# Patient Record
Sex: Female | Born: 1965 | Race: Black or African American | Hispanic: No | Marital: Married | Smoking: Never smoker
Health system: Southern US, Community
[De-identification: ages and names within clinical notes are randomized; demographics above are authoritative.]

## PROBLEM LIST (undated history)

## (undated) DIAGNOSIS — E0590 Thyrotoxicosis, unspecified without thyrotoxic crisis or storm: Secondary | ICD-10-CM

## (undated) DIAGNOSIS — I10 Essential (primary) hypertension: Secondary | ICD-10-CM

## (undated) DIAGNOSIS — D649 Anemia, unspecified: Secondary | ICD-10-CM

## (undated) DIAGNOSIS — K219 Gastro-esophageal reflux disease without esophagitis: Secondary | ICD-10-CM

## (undated) DIAGNOSIS — E785 Hyperlipidemia, unspecified: Secondary | ICD-10-CM

## (undated) HISTORY — DX: Hyperlipidemia, unspecified: E78.5

## (undated) HISTORY — PX: HERNIA REPAIR: SHX51

## (undated) HISTORY — PX: TUBAL LIGATION: SHX77

## (undated) HISTORY — DX: Anemia, unspecified: D64.9

## (undated) HISTORY — DX: Gastro-esophageal reflux disease without esophagitis: K21.9

## (undated) HISTORY — PX: MOUTH SURGERY: SHX715

---

## 2001-08-01 ENCOUNTER — Ambulatory Visit (HOSPITAL_COMMUNITY): Admission: RE | Admit: 2001-08-01 | Discharge: 2001-08-01 | Payer: Self-pay | Admitting: Internal Medicine

## 2001-08-01 ENCOUNTER — Encounter: Payer: Self-pay | Admitting: Internal Medicine

## 2005-05-31 ENCOUNTER — Ambulatory Visit (HOSPITAL_COMMUNITY): Admission: RE | Admit: 2005-05-31 | Discharge: 2005-05-31 | Payer: Self-pay | Admitting: Surgery

## 2005-08-29 ENCOUNTER — Emergency Department (HOSPITAL_COMMUNITY): Admission: EM | Admit: 2005-08-29 | Discharge: 2005-08-30 | Payer: Self-pay | Admitting: Emergency Medicine

## 2006-01-11 ENCOUNTER — Ambulatory Visit: Payer: Self-pay | Admitting: Family Medicine

## 2006-03-01 ENCOUNTER — Ambulatory Visit: Payer: Self-pay | Admitting: Family Medicine

## 2006-08-01 DIAGNOSIS — E119 Type 2 diabetes mellitus without complications: Secondary | ICD-10-CM | POA: Insufficient documentation

## 2006-08-01 DIAGNOSIS — E785 Hyperlipidemia, unspecified: Secondary | ICD-10-CM | POA: Insufficient documentation

## 2006-08-01 DIAGNOSIS — E039 Hypothyroidism, unspecified: Secondary | ICD-10-CM | POA: Insufficient documentation

## 2006-08-01 DIAGNOSIS — I10 Essential (primary) hypertension: Secondary | ICD-10-CM | POA: Insufficient documentation

## 2010-08-23 ENCOUNTER — Other Ambulatory Visit: Payer: Self-pay | Admitting: Obstetrics and Gynecology

## 2010-08-23 DIAGNOSIS — Z1231 Encounter for screening mammogram for malignant neoplasm of breast: Secondary | ICD-10-CM

## 2010-08-31 ENCOUNTER — Ambulatory Visit
Admission: RE | Admit: 2010-08-31 | Discharge: 2010-08-31 | Disposition: A | Discharge status: Home / Self Care | Payer: Federal, State, Local not specified - PPO | Source: Ambulatory Visit | Attending: Obstetrics and Gynecology | Admitting: Obstetrics and Gynecology

## 2010-08-31 DIAGNOSIS — Z1231 Encounter for screening mammogram for malignant neoplasm of breast: Secondary | ICD-10-CM

## 2011-01-11 ENCOUNTER — Other Ambulatory Visit: Payer: Self-pay

## 2011-01-11 ENCOUNTER — Encounter (HOSPITAL_COMMUNITY)
Admission: RE | Admit: 2011-01-11 | Discharge: 2011-01-11 | Disposition: A | Discharge status: Home / Self Care | Payer: Federal, State, Local not specified - PPO | Source: Ambulatory Visit | Attending: Obstetrics and Gynecology | Admitting: Obstetrics and Gynecology

## 2011-01-11 ENCOUNTER — Encounter (HOSPITAL_COMMUNITY): Payer: Self-pay

## 2011-01-11 HISTORY — DX: Essential (primary) hypertension: I10

## 2011-01-11 HISTORY — DX: Thyrotoxicosis, unspecified without thyrotoxic crisis or storm: E05.90

## 2011-01-11 LAB — CBC
Hemoglobin: 11.6 g/dL — ABNORMAL LOW (ref 12.0–15.0)
MCH: 26.4 pg (ref 26.0–34.0)
MCHC: 32.3 g/dL (ref 30.0–36.0)
RDW: 14.4 % (ref 11.5–15.5)

## 2011-01-11 LAB — BASIC METABOLIC PANEL
Calcium: 9.8 mg/dL (ref 8.4–10.5)
GFR calc Af Amer: >60 mL/min (ref >60)
GFR calc non Af Amer: >60 mL/min (ref >60)
Glucose, Bld: 167 mg/dL — ABNORMAL HIGH (ref 70–99)
Sodium: 138 mEq/L (ref 135–145)

## 2011-01-11 NOTE — Patient Instructions (Signed)
20 Alizia Benefiel  01/11/2011   Your procedure is scheduled on:  01/16/11  Report to [facility] at 0745 AM....main entrance  Call this number if you have problems the morning of surgery: [phone]   Remember:   Do not eat food:After Midnight.  Do not drink clear liquids: 4 Hours before arrival.  Take these medicines the morning of surgery with A SIP OF WATER: blood pressure meds   Do not wear jewelry, make-up or nail polish.  Do not bring valuables to the hospital.  Contacts, dentures or bridgework may not be worn into surgery.  Leave suitcase in the car. After surgery it may be brought to your room.  For patients admitted to the hospital, checkout time is 11:00 AM the day of discharge.   Patients discharged the day of surgery will not be allowed to drive home.  Name and phone number of your driver: Minerva Areola- [phone]  Special Instructions: N/A   Please read over the following fact sheets that you were given:

## 2011-01-13 NOTE — H&P (Signed)
NAMEADRIEANNA, BOTELER NO.:  [id number]  MEDICAL RECORD NO.:  [id number]  LOCATION:                                 FACILITY:  PHYSICIAN:  Janine Limbo, M.D.DATE OF BIRTH:  08/08/1965  DATE OF ADMISSION:  01/16/2011 DATE OF DISCHARGE:                             HISTORY & PHYSICAL   HISTORY OF PRESENT ILLNESS:  Ms. Puccinelli is a 45 year old female, para 4- 0-3-4, who presents for hysteroscopy with dilatation and curettage.  She will also have resection of endometrial polyps.  The patient has been followed at the [facility].  She complains of menorrhagia and dysmenorrhea.  The patient has had a sonohysterogram which showed endometrial polyps.  No adnexal masses were appreciated.  The patient recently had colposcopy and biopsies which showed cervical intraepithelial neoplasia I.  The patient has had a cesarean section in the past as well as a tubal ligation.  The patient has hypertension and diabetes.  DRUG ALLERGIES:  No known drug allergies.  OBSTETRICAL HISTORY:  The patient has had 4 term deliveries.  One delivery was by cesarean section.  She has had 1 miscarriage and 2 elective pregnancy terminations.  She has had diabetes of pregnancy.  PAST MEDICAL HISTORY:  The patient has hypertension, diabetes, anemia, and elevated cholesterol.  CURRENT MEDICATIONS: 1. Metformin 1000 mg each day. 2. Exforge 10 mg/320 mg. 3. Simvastatin 20 mg. 4. Iron 65 mg. 5. Vitamin D 1000 international units each day. 6. Lantus insulin p.r.n.  SOCIAL HISTORY:  The patient denies cigarette use, alcohol use, and recreational drug use.  REVIEW OF SYSTEMS:  Please see history of present illness.  FAMILY HISTORY:  The patient has a family history of hypertension, diabetes, and strokes.  PHYSICAL EXAMINATION:  VITAL SIGNS:  Weight is 195 pounds, height is 5 feet and 2 inches. HEENT:  Within  normal limits. CHEST:  Clear. HEART:  Regular rate and rhythm. BREASTS:  Without masses. ABDOMEN:  Nontender. EXTREMITIES:  Grossly normal. NEUROLOGIC:  Grossly normal. PELVIC:  External genitalia is normal.  The vagina shows a cystocele and rectocele.  Cervix is nontender.  Uterus is normal size.  Adnexa, no masses and rectovaginal exam confirms.  ASSESSMENT: 1. Menorrhagia. 2. Dysmenorrhea. 3. Endometrial polyps. 4. Hypertension. 5. Diabetes. 6. Increased body mass index.  PLAN:  The patient will undergo hysteroscopy with resection of the endometrial polyps.  She will also have a dilatation and curettage.  She understands indications for her procedure and she accepts the risks of, but not limited to, anesthetic complications, bleeding, infections, and possible damage to the surrounding organs.     Janine Limbo, M.D.     AVS/MEDQ  D:  01/12/2011  T:  01/13/2011  Job:  [id number]

## 2011-01-16 ENCOUNTER — Encounter (HOSPITAL_COMMUNITY): Payer: Self-pay | Admitting: Anesthesiology

## 2011-01-16 ENCOUNTER — Ambulatory Visit (HOSPITAL_COMMUNITY)
Admission: RE | Admit: 2011-01-16 | Discharge: 2011-01-16 | Disposition: A | Discharge status: Home / Self Care | Payer: Federal, State, Local not specified - PPO | Source: Ambulatory Visit | Attending: Obstetrics and Gynecology | Admitting: Obstetrics and Gynecology

## 2011-01-16 ENCOUNTER — Encounter (HOSPITAL_COMMUNITY)
Admission: RE | Disposition: A | Discharge status: Home / Self Care | Payer: Self-pay | Source: Ambulatory Visit | Attending: Obstetrics and Gynecology

## 2011-01-16 ENCOUNTER — Other Ambulatory Visit: Payer: Self-pay | Admitting: Obstetrics and Gynecology

## 2011-01-16 ENCOUNTER — Ambulatory Visit (HOSPITAL_COMMUNITY): Payer: Federal, State, Local not specified - PPO | Admitting: Anesthesiology

## 2011-01-16 DIAGNOSIS — N921 Excessive and frequent menstruation with irregular cycle: Secondary | ICD-10-CM | POA: Diagnosis present

## 2011-01-16 DIAGNOSIS — N920 Excessive and frequent menstruation with regular cycle: Secondary | ICD-10-CM | POA: Insufficient documentation

## 2011-01-16 DIAGNOSIS — N946 Dysmenorrhea, unspecified: Secondary | ICD-10-CM | POA: Insufficient documentation

## 2011-01-16 DIAGNOSIS — Z01818 Encounter for other preprocedural examination: Secondary | ICD-10-CM | POA: Insufficient documentation

## 2011-01-16 DIAGNOSIS — Z01812 Encounter for preprocedural laboratory examination: Secondary | ICD-10-CM | POA: Insufficient documentation

## 2011-01-16 DIAGNOSIS — I10 Essential (primary) hypertension: Secondary | ICD-10-CM | POA: Insufficient documentation

## 2011-01-16 DIAGNOSIS — N926 Irregular menstruation, unspecified: Secondary | ICD-10-CM

## 2011-01-16 DIAGNOSIS — E119 Type 2 diabetes mellitus without complications: Secondary | ICD-10-CM | POA: Insufficient documentation

## 2011-01-16 LAB — GLUCOSE, CAPILLARY: Glucose-Capillary: 207 mg/dL — ABNORMAL HIGH (ref 70–99)

## 2011-01-16 SURGERY — DILATATION & CURETTAGE/HYSTEROSCOPY WITH RESECTOCOPE
Anesthesia: Monitor Anesthesia Care | Wound class: Clean Contaminated

## 2011-01-16 MED ORDER — ACETAMINOPHEN 10 MG/ML IV SOLN
1000.0000 mg | Freq: Once | INTRAVENOUS | Status: DC | PRN
  Filled 2011-01-16: qty 100

## 2011-01-16 MED ORDER — HYDROCODONE-ACETAMINOPHEN 5-325 MG PO TABS: 1.0000 | ORAL_TABLET | Freq: Once | ORAL | Status: DC

## 2011-01-16 MED ORDER — HYDROCODONE-ACETAMINOPHEN 5-500 MG PO TABS: 1.0000 | ORAL_TABLET | ORAL | Status: AC | PRN

## 2011-01-16 MED ORDER — FENTANYL CITRATE 0.05 MG/ML IJ SOLN
INTRAMUSCULAR | Status: DC | PRN
  Administered 2011-01-16 (×4): 50 ug via INTRAVENOUS

## 2011-01-16 MED ORDER — PROMETHAZINE HCL 12.5 MG PO TABS: 12.5000 mg | ORAL_TABLET | Freq: Four times a day (QID) | ORAL | Status: AC | PRN

## 2011-01-16 MED ORDER — LACTATED RINGERS IV SOLN
INTRAVENOUS | Status: DC
  Administered 2011-01-16 (×2): via INTRAVENOUS

## 2011-01-16 MED ORDER — DEXAMETHASONE SODIUM PHOSPHATE 10 MG/ML IJ SOLN
INTRAMUSCULAR | Status: AC
  Filled 2011-01-16: qty 1

## 2011-01-16 MED ORDER — PROMETHAZINE HCL 25 MG/ML IJ SOLN: 6.2500 mg | INTRAMUSCULAR | Status: DC | PRN

## 2011-01-16 MED ORDER — MIDAZOLAM HCL 5 MG/5ML IJ SOLN
INTRAMUSCULAR | Status: DC | PRN
  Administered 2011-01-16 (×2): 1 mg via INTRAVENOUS

## 2011-01-16 MED ORDER — BUPIVACAINE-EPINEPHRINE 0.5% -1:200000 IJ SOLN
INTRAMUSCULAR | Status: DC | PRN
  Administered 2011-01-16: 10 mL

## 2011-01-16 MED ORDER — PROPOFOL 10 MG/ML IV EMUL
INTRAVENOUS | Status: AC
  Filled 2011-01-16: qty 20

## 2011-01-16 MED ORDER — ONDANSETRON HCL 4 MG/2ML IJ SOLN
INTRAMUSCULAR | Status: AC
  Filled 2011-01-16: qty 2

## 2011-01-16 MED ORDER — HYDROMORPHONE HCL 1 MG/ML IJ SOLN: 0.2500 mg | INTRAMUSCULAR | Status: DC | PRN

## 2011-01-16 MED ORDER — GLYCINE 1.5 % IR SOLN
Status: DC | PRN
  Administered 2011-01-16: 3000 mL

## 2011-01-16 MED ORDER — LIDOCAINE HCL (CARDIAC) 20 MG/ML IV SOLN
INTRAVENOUS | Status: AC
  Filled 2011-01-16: qty 5

## 2011-01-16 MED ORDER — PROPOFOL 10 MG/ML IV EMUL
INTRAVENOUS | Status: DC | PRN
  Administered 2011-01-16: 200 mg via INTRAVENOUS

## 2011-01-16 MED ORDER — KETOROLAC TROMETHAMINE 30 MG/ML IJ SOLN
INTRAMUSCULAR | Status: AC
  Filled 2011-01-16: qty 1

## 2011-01-16 MED ORDER — ONDANSETRON HCL 4 MG/2ML IJ SOLN
INTRAMUSCULAR | Status: DC | PRN
  Administered 2011-01-16: 4 mg via INTRAVENOUS

## 2011-01-16 MED ORDER — FENTANYL CITRATE 0.05 MG/ML IJ SOLN
INTRAMUSCULAR | Status: AC
  Filled 2011-01-16: qty 5

## 2011-01-16 MED ORDER — DEXAMETHASONE SODIUM PHOSPHATE 4 MG/ML IJ SOLN
INTRAMUSCULAR | Status: DC | PRN
  Administered 2011-01-16: 10 mg via INTRAVENOUS

## 2011-01-16 MED ORDER — MIDAZOLAM HCL 2 MG/2ML IJ SOLN
INTRAMUSCULAR | Status: AC
  Filled 2011-01-16: qty 2

## 2011-01-16 MED ORDER — ACETAMINOPHEN 325 MG PO TABS: 325.0000 mg | ORAL_TABLET | ORAL | Status: DC | PRN

## 2011-01-16 MED ORDER — LIDOCAINE HCL (CARDIAC) 20 MG/ML IV SOLN
INTRAVENOUS | Status: DC | PRN
  Administered 2011-01-16: 30 mg via INTRAVENOUS
  Administered 2011-01-16: 20 mg via INTRAVENOUS

## 2011-01-16 MED ORDER — IBUPROFEN 200 MG PO TABS: 800.0000 mg | ORAL_TABLET | Freq: Three times a day (TID) | ORAL | Status: AC | PRN

## 2011-01-16 MED ORDER — KETOROLAC TROMETHAMINE 30 MG/ML IJ SOLN
INTRAMUSCULAR | Status: DC | PRN
  Administered 2011-01-16: 30 mg via INTRAVENOUS

## 2011-01-16 MED ORDER — MEPERIDINE HCL 25 MG/ML IJ SOLN: 6.2500 mg | INTRAMUSCULAR | Status: DC | PRN

## 2011-01-16 SURGICAL SUPPLY — 18 items
CANISTER SUCTION 2500CC (MISCELLANEOUS) ×2 IMPLANT
CATH ROBINSON RED A/P 16FR (CATHETERS) ×2 IMPLANT
CLOTH BEACON ORANGE TIMEOUT ST (SAFETY) ×2 IMPLANT
CONTAINER PREFILL 10% NBF 60ML (FORM) ×4 IMPLANT
DRAPE UTILITY XL STRL (DRAPES) ×2 IMPLANT
ELECT REM PT RETURN 9FT ADLT (ELECTROSURGICAL)
ELECTRODE REM PT RTRN 9FT ADLT (ELECTROSURGICAL) IMPLANT
ELECTRODE ROLLER BARREL 22FR (ELECTROSURGICAL) IMPLANT
ELECTRODE VAPORCUT 22FR (ELECTROSURGICAL) ×1 IMPLANT
GLOVE BIOGEL PI IND STRL 8.5 (GLOVE) ×1 IMPLANT
GLOVE BIOGEL PI INDICATOR 8.5 (GLOVE) ×1
GLOVE ECLIPSE 8.0 STRL XLNG CF (GLOVE) ×4 IMPLANT
GOWN PREVENTION PLUS LG XLONG (DISPOSABLE) ×2 IMPLANT
GOWN PREVENTION PLUS XXLARGE (GOWN DISPOSABLE) ×2 IMPLANT
LOOP ANGLED CUTTING 22FR (CUTTING LOOP) IMPLANT
PACK HYSTEROSCOPY LF (CUSTOM PROCEDURE TRAY) ×2 IMPLANT
TOWEL OR 17X24 6PK STRL BLUE (TOWEL DISPOSABLE) ×4 IMPLANT
WATER STERILE IRR 1000ML POUR (IV SOLUTION) ×2 IMPLANT

## 2011-01-16 NOTE — Anesthesia Procedure Notes (Signed)
Procedure Name: LMA Insertion Performed by: Suella Grove Pre-anesthesia Checklist: Patient identified, Patient being monitored, Emergency Drugs available, Timeout performed and Suction available Patient Re-evaluated:Patient Re-evaluated prior to inductionOxygen Delivery Method: Circle System Utilized Preoxygenation: Pre-oxygenation with 100% oxygen Intubation Type: IV induction Ventilation: Mask ventilation without difficulty LMA: LMA inserted LMA Size: 4.0 Grade View: Grade I Tube secured with: Tape

## 2011-01-16 NOTE — Anesthesia Preprocedure Evaluation (Signed)
Anesthesia Evaluation  Name, MR# and DOB Patient awake  General Assessment Comment  Reviewed: Allergy & Precautions, H&P , Patient's Chart, lab work & pertinent test results and reviewed documented beta blocker date and time   History of Anesthesia Complications Negative for: history of anesthetic complications  Airway Mallampati: III TM Distance: >3 FB Neck ROM: full    Dental No notable dental hx    Pulmonaryneg pulmonary ROS    clear to auscultation  pulmonary exam normal   Cardiovascular Exercise Tolerance: Good hypertension, regular Normal   Neuro/PsychNegative Neurological ROS Negative Psych ROS  GI/Hepatic/Renal negative GI ROS, negative Liver ROS, and negative Renal ROS (+)       Endo/Other  Negative Endocrine ROS (+) Diabetes mellitus- Hyperthyroidism,  Abdominal   Musculoskeletal  Hematology negative hematology ROS (+)   Peds  Reproductive/Obstetrics negative OB ROS   Anesthesia Other Findings             Anesthesia Physical Anesthesia Plan  ASA: III  Anesthesia Plan: General   Post-op Pain Management:    Induction:   Airway Management Planned:   Additional Equipment:   Intra-op Plan:   Post-operative Plan:   Informed Consent: I have reviewed the patients History and Physical, chart, labs and discussed the procedure including the risks, benefits and alternatives for the proposed anesthesia with the patient or authorized representative who has indicated his/her understanding and acceptance.   Dental Advisory Given  Plan Discussed with: CRNA and Surgeon  Anesthesia Plan Comments:         Anesthesia Quick Evaluation

## 2011-01-16 NOTE — Anesthesia Postprocedure Evaluation (Signed)
  Anesthesia Post-op Note  Patient: Sara Mcfarland  Procedure(s) Performed:  DILATATION & CURETTAGE/HYSTEROSCOPY WITH RESECTOCOPE Patient's cardiopulmonary status is stable Patient's level of consciousness: sedate but responsive verbally Pain and nausea are all reasonably controlled No anesthetic complications apparent at this time No follow up care necessary at this time

## 2011-01-16 NOTE — Op Note (Signed)
NAMEBRYNNLEIGH, Sara Mcfarland NO.:  [id number]  MEDICAL RECORD NO.:  [id number]  LOCATION:  [facility]                          FACILITY:  [facility]  PHYSICIAN:  Janine Limbo, M.D.DATE OF BIRTH:  1966-04-24  DATE OF PROCEDURE:  01/16/2011 DATE OF DISCHARGE:                              OPERATIVE REPORT   PREOPERATIVE DIAGNOSES: 1. Irregular uterine bleeding. 2. Endometrial polyps. 3. Hypertension. 4. Diabetes. 5. Elevated body mass index. 6. Anemia.  POSTOPERATIVE DIAGNOSES: 1. Irregular uterine bleeding. 2. Endometrial polyps. 3. Hypertension. 4. Diabetes. 5. Elevated body mass index. 6. Anemia.  PROCEDURE: 1. Hysteroscopy with resection of endometrial polyps. 2. Dilatation and curettage.  SURGEON:  Janine Limbo, MD  FIRST ASSISTANT:  None.  ANESTHETIC:  General.  DISPOSITION:  Sara Mcfarland is a 45 year old female who presents with the above-mentioned diagnoses.  She understands the indications for her surgical procedure and she accepts the risks of, but not limited to, anesthetic complications, bleeding, infection, and possible damage to the surrounding organs.  FINDINGS:  The uterus was noted to be 8-week size and firm.  No adnexal masses were appreciated.  On hysteroscopy, the patient was noted to have 3 endometrial polyps measuring less than 1 cm in size on the lower uterine segment.  The tubal ostia appeared normal.  No other pathology was appreciated.  DESCRIPTION OF PROCEDURE:  The patient was taken to the operating room where general anesthetic was given.  The patient's perineum and vagina were prepped with multiple layers of Betadine.  The bladder was drained 200 mL of clear urine.  The patient was sterilely draped.  Examination under anesthesia was performed.  Findings were mentioned above.  A paracervical block was placed using 10 mL of 0.5% Marcaine with epinephrine.  An endocervical curettage was then performed.  The uterus sounded to 8  and 1.5 cm.  The cervix was gently dilated.  The diagnostic hysteroscope was inserted and the cavity was carefully inspected. Pictures were taken.  The polyps were noted.  The diagnostic hysteroscope was removed and the cervix was dilated further.  The operative hysteroscope was then inserted.  The polyps on the lower uterine segment were then resected using a single loop.  Hemostasis was noted to be adequate.  The hysteroscope was then removed and the cavity was curetted using a medium sharp curette until it was felt to be clean. Hemostasis was confirmed.  Sponge and needle counts were correct.  The estimated blood loss was less than 10 mL.  The estimated fluid deficit was 105 mL.  The patient's exam was repeated and the uterus was noted to be firm.  The patient was returned to the supine position.  She was awakened from her anesthetic without difficulty.  She was transferred to recovery room in stable condition.  The endocervical curettings, endometrial resections, and endometrial curettings were sent to Pathology.  FOLLOWUP INSTRUCTIONS:  The patient will return to see Dr. Stefano Gaul in 2 weeks for followup examination.  She was given a prescription for; 1. Motrin 800 mg every 8 hours as needed for mild-to-moderate pain. 2. Vicodin 1 or 2 tablets every 4 hours as needed for severe pain. 3. Phenergan 12.5 mg every 6  hours as needed for nausea.  The patient was given a copy of the postoperative instructions for patients who have undergone hysteroscopy.     Janine Limbo, M.D.     AVS/MEDQ  D:  01/16/2011  T:  01/16/2011  Job:  [id number]

## 2011-01-16 NOTE — Interval H&P Note (Signed)
History and Physical Interval Note:   01/16/2011   8:38 AM   Sara Mcfarland  has presented today for surgery, with the diagnosis of irregular bleeding;polyp  The various methods of treatment have been discussed with the patient and family. After consideration of risks, benefits and other options for treatment, the patient has consented to  Procedure(s): DILATATION & CURETTAGE/HYSTEROSCOPY WITH RESECTOCOPE as a surgical intervention .  I have reviewed the patients' chart and labs.  Questions were answered to the patient's satisfaction.     Janine Limbo  MD

## 2011-01-16 NOTE — Brief Op Note (Signed)
01/16/2011  10:43 AM  PATIENT:  Sara Mcfarland  45 y.o. female  PRE-OPERATIVE DIAGNOSIS:  irregular bleeding;polyp  POST-OPERATIVE DIAGNOSIS:  irrregular bleeding, polyp  PROCEDURE:  Procedure(s): DILATATION & CURETTAGE/HYSTEROSCOPY WITH RESECTOCOPE  SURGEON:  Surgeon(s): Janine Limbo, MD  PHYSICIAN ASSISTANT:   ASSISTANTS: none   ANESTHESIA:   general  ESTIMATED BLOOD LOSS: * No blood loss amount entered *   BLOOD ADMINISTERED:none  DRAINS: none   LOCAL MEDICATIONS USED:  MARCAINE 10CC  SPECIMEN:  Source of Specimen:  ECC, Endometrial resections, Endometrial curettings  DISPOSITION OF SPECIMEN:  PATHOLOGY  COUNTS:  NO   TOURNIQUET:  * No tourniquets in log *  DICTATION #: [id number]  PLAN OF CARE: Home  PATIENT DISPOSITION:  PACU - hemodynamically stable.   Delay start of Pharmacological VTE agent (>24hrs) due to surgical blood loss or risk of bleeding:  not applicable

## 2011-01-16 NOTE — Transfer of Care (Signed)
Immediate Anesthesia Transfer of Care Note  Patient: Sara Mcfarland  Procedure(s) Performed:  DILATATION & CURETTAGE/HYSTEROSCOPY WITH RESECTOCOPE  Patient Location: PACU  Anesthesia Type: General  Level of Consciousness: awake, alert , oriented, patient cooperative and responds to stimulation  Airway & Oxygen Therapy: Patient Spontanous Breathing and Patient connected to nasal cannula oxygen  Post-op Assessment: Report given to PACU RN, Post -op Vital signs reviewed and stable and Patient moving all extremities  Post vital signs: Reviewed and stable  Complications: No apparent anesthesia complications

## 2015-02-17 ENCOUNTER — Emergency Department (HOSPITAL_COMMUNITY)
Admission: EM | Admit: 2015-02-17 | Discharge: 2015-02-17 | Disposition: A | Discharge status: Home / Self Care | Payer: Federal, State, Local not specified - PPO | Attending: Emergency Medicine | Admitting: Emergency Medicine

## 2015-02-17 ENCOUNTER — Encounter (HOSPITAL_COMMUNITY): Payer: Self-pay | Admitting: Emergency Medicine

## 2015-02-17 DIAGNOSIS — I10 Essential (primary) hypertension: Secondary | ICD-10-CM | POA: Diagnosis not present

## 2015-02-17 DIAGNOSIS — Z794 Long term (current) use of insulin: Secondary | ICD-10-CM | POA: Insufficient documentation

## 2015-02-17 DIAGNOSIS — Z79899 Other long term (current) drug therapy: Secondary | ICD-10-CM | POA: Insufficient documentation

## 2015-02-17 DIAGNOSIS — L02211 Cutaneous abscess of abdominal wall: Secondary | ICD-10-CM | POA: Insufficient documentation

## 2015-02-17 DIAGNOSIS — E0590 Thyrotoxicosis, unspecified without thyrotoxic crisis or storm: Secondary | ICD-10-CM | POA: Insufficient documentation

## 2015-02-17 DIAGNOSIS — L0291 Cutaneous abscess, unspecified: Secondary | ICD-10-CM

## 2015-02-17 DIAGNOSIS — E119 Type 2 diabetes mellitus without complications: Secondary | ICD-10-CM | POA: Diagnosis not present

## 2015-02-17 MED ORDER — SULFAMETHOXAZOLE-TRIMETHOPRIM 800-160 MG PO TABS: 1.0000 | ORAL_TABLET | Freq: Two times a day (BID) | ORAL | Status: AC

## 2015-02-17 MED ORDER — LIDOCAINE-EPINEPHRINE (PF) 2 %-1:200000 IJ SOLN
10.0000 mL | Freq: Once | INTRAMUSCULAR | Status: AC
  Administered 2015-02-17: 10 mL via INTRADERMAL

## 2015-02-17 MED ORDER — SULFAMETHOXAZOLE-TRIMETHOPRIM 800-160 MG PO TABS
1.0000 | ORAL_TABLET | Freq: Once | ORAL | Status: AC
  Administered 2015-02-17: 1 via ORAL
  Filled 2015-02-17: qty 1

## 2015-02-17 MED ORDER — CEPHALEXIN 500 MG PO CAPS
500.0000 mg | ORAL_CAPSULE | Freq: Once | ORAL | Status: AC
  Administered 2015-02-17: 500 mg via ORAL
  Filled 2015-02-17: qty 1

## 2015-02-17 MED ORDER — CEPHALEXIN 500 MG PO CAPS: 500.0000 mg | ORAL_CAPSULE | Freq: Four times a day (QID) | ORAL | Status: DC

## 2015-02-17 MED ORDER — LIDOCAINE-EPINEPHRINE (PF) 2 %-1:200000 IJ SOLN
INTRAMUSCULAR | Status: AC
  Administered 2015-02-17: 10 mL via INTRADERMAL
  Filled 2015-02-17: qty 20

## 2015-02-17 NOTE — Progress Notes (Signed)
EDCM spoke to patient and her husband at bedside.  Patient confirms her pcp is Dr. Jeanann Lewandowsky.  System updated.

## 2015-02-17 NOTE — ED Notes (Signed)
Pt is a&ox4 and ambulatory. Instruction reviewed, questions denied

## 2015-02-17 NOTE — ED Provider Notes (Signed)
CSN: [id number]     Arrival date & time 02/17/15  1432 History   First MD Initiated Contact with Patient 02/17/15 1600     Chief Complaint  Patient presents with  . Abscess     (Consider location/radiation/quality/duration/timing/severity/associated sxs/prior Treatment) Patient is a 49 y.o. female presenting with abscess. The history is provided by the patient. No language interpreter was used.  Abscess Associated symptoms: no fever   Sara Mcfarland is a 49 y.o female with a history of hypertension, diabetes and thyroid disease who presents for right abdominal abscess that she states has worsened for the past week. She was seen by her gynecologist who referred her to her endocrinologist and he sent her to the ED. She states the abscess started draining 2 days ago. She denies any fever, chills, nausea, vomiting, difficulty urinating.  Past Medical History  Diagnosis Date  . Hypertension   . Diabetes mellitus   . Hyperthyroidism     h/o Graves disease   Past Surgical History  Procedure Laterality Date  . Hernia repair    . Cesarean section    . Tubal ligation     No family history on file. Social History  Substance Use Topics  . Smoking status: Never Smoker   . Smokeless tobacco: None  . Alcohol Use: No   OB History    No data available     Review of Systems  Constitutional: Negative for fever.  All other systems reviewed and are negative.     Allergies  Review of patient's allergies indicates no known allergies.  Home Medications   Prior to Admission medications   Medication Sig Start Date End Date Taking? Authorizing Provider  ferrous sulfate 325 (65 FE) MG tablet Take 325 mg by mouth every evening.   Yes Historical Provider, MD  ibuprofen (ADVIL,MOTRIN) 200 MG tablet Take 400 mg by mouth every 6 (six) hours as needed for mild pain.   Yes Historical Provider, MD  insulin glargine (LANTUS) 100 UNIT/ML injection Inject 40 Units into the skin at bedtime.    Yes  Historical Provider, MD  levothyroxine (SYNTHROID, LEVOTHROID) 125 MCG tablet Take 1 tablet by mouth daily. 02/05/15  Yes Historical Provider, MD  metFORMIN (GLUCOPHAGE) 1000 MG tablet Take 1,000 mg by mouth 2 (two) times daily with a meal.     Yes Historical Provider, MD  olmesartan-hydrochlorothiazide (BENICAR HCT) 40-25 MG per tablet Take 1 tablet by mouth daily.   Yes Historical Provider, MD  omeprazole (PRILOSEC) 20 MG capsule Take 1 capsule by mouth daily. 02/09/15  Yes Historical Provider, MD  pravastatin (PRAVACHOL) 40 MG tablet Take 1 tablet by mouth daily. 02/09/15  Yes Historical Provider, MD  valsartan-hydrochlorothiazide (DIOVAN-HCT) 160-12.5 MG per tablet Take 1 tablet by mouth daily.   Yes Historical Provider, MD  cephALEXin (KEFLEX) 500 MG capsule Take 1 capsule (500 mg total) by mouth 4 (four) times daily. 02/17/15   Angelyse Heslin Patel-Mills, PA-C  OVER THE COUNTER MEDICATION Take 1 tablet by mouth daily. vitiman D 1000mg      Historical Provider, MD  sulfamethoxazole-trimethoprim (BACTRIM DS,SEPTRA DS) 800-160 MG per tablet Take 1 tablet by mouth 2 (two) times daily. 02/17/15 02/24/15  Bernardine Langworthy Patel-Mills, PA-C   BP 179/83 mmHg  Pulse 91  Temp(Src) 98.3 F (36.8 C) (Oral)  Resp 15  SpO2 100%  LMP 02/17/2015 Physical Exam  Constitutional: She is oriented to person, place, and time. She appears well-developed and well-nourished.  HENT:  Head: Normocephalic.  Eyes: Conjunctivae are normal.  Neck: Neck supple.  Cardiovascular: Normal rate, regular rhythm and normal heart sounds.   Pulmonary/Chest: Effort normal and breath sounds normal. No respiratory distress. She has no wheezes.  Abdominal: Soft.  Musculoskeletal: Normal range of motion.  Neurological: She is alert and oriented to person, place, and time.  Skin: Skin is warm.  Large right abdominal abscess with active drainage that is white and yellow in color. The abscess measures 6 x 6 cm with surrounding erythema, tenderness and  warmth.  Psychiatric: She has a normal mood and affect.  Nursing note and vitals reviewed.   ED Course  Procedures (including critical care time) Labs Review Labs Reviewed - No data to display INCISION AND DRAINAGE Performed by: Ottie Glazier Consent: Verbal consent obtained. Risks and benefits: risks, benefits and alternatives were discussed Type: abscess Body area: right lower abdomen Anesthesia: local infiltration Incision was made with a scalpel: 11 blade Local anesthetic: lidocaine 2% with epinephrine Anesthetic total: 8 ml Complexity: complex Blunt dissection to break up loculations Drainage: purulent Drainage amount: 20cc Packing material: 1/4 in iodoform gauze Patient tolerance: Patient tolerated the procedure well with no immediate complications.  Imaging Review No results found. I have personally reviewed and evaluated these images and lab results as part of my medical decision-making.   EKG Interpretation None      MDM   Final diagnoses:  Abscess  Patient presents for right abdominal abscess with surrounding cellulitis that she noticed 1 week ago. She is afebrile and well-appearing. Abscess drained. Rx: Keflex and bactrim I discussed return precautions such as fever, increased swelling, or no improvement.  I also showed her and her husband how to pack the wound tomorrow. I also recommended follow up with her pcp or return to the ED for recheck in 24-48 hours.  Patient and her husband agrees with the plan.     Ottie Glazier, PA-C 02/18/15 [place], MD 02/18/15 [phone]

## 2015-02-17 NOTE — ED Notes (Addendum)
Per pt, has an abscess on her right abdomin-noticed it a week-states it is draining-per patient states she saw her Endocrinologist and he sent her here so she could see surgeon

## 2015-02-17 NOTE — Discharge Instructions (Signed)
Abscess Return for fever, increased swelling or redness. Apply warm compresses several times a day.  Take antibiotics as prescribed. An abscess is an infected area that contains a collection of pus and debris.It can occur in almost any part of the body. An abscess is also known as a furuncle or boil. CAUSES  An abscess occurs when tissue gets infected. This can occur from blockage of oil or sweat glands, infection of hair follicles, or a minor injury to the skin. As the body tries to fight the infection, pus collects in the area and creates pressure under the skin. This pressure causes pain. People with weakened immune systems have difficulty fighting infections and get certain abscesses more often.  SYMPTOMS Usually an abscess develops on the skin and becomes a painful mass that is red, warm, and tender. If the abscess forms under the skin, you may feel a moveable soft area under the skin. Some abscesses break open (rupture) on their own, but most will continue to get worse without care. The infection can spread deeper into the body and eventually into the bloodstream, causing you to feel ill.  DIAGNOSIS  Your caregiver will take your medical history and perform a physical exam. A sample of fluid may also be taken from the abscess to determine what is causing your infection. TREATMENT  Your caregiver may prescribe antibiotic medicines to fight the infection. However, taking antibiotics alone usually does not cure an abscess. Your caregiver may need to make a small cut (incision) in the abscess to drain the pus. In some cases, gauze is packed into the abscess to reduce pain and to continue draining the area. HOME CARE INSTRUCTIONS   Only take over-the-counter or prescription medicines for pain, discomfort, or fever as directed by your caregiver.  If you were prescribed antibiotics, take them as directed. Finish them even if you start to feel better.  If gauze is used, follow your caregiver's  directions for changing the gauze.  To avoid spreading the infection:  Keep your draining abscess covered with a bandage.  Wash your hands well.  Do not share personal care items, towels, or whirlpools with others.  Avoid skin contact with others.  Keep your skin and clothes clean around the abscess.  Keep all follow-up appointments as directed by your caregiver. SEEK MEDICAL CARE IF:   You have increased pain, swelling, redness, fluid drainage, or bleeding.  You have muscle aches, chills, or a general ill feeling.  You have a fever. MAKE SURE YOU:   Understand these instructions.  Will watch your condition.  Will get help right away if you are not doing well or get worse. Document Released: 03/15/2005 Document Revised: 12/05/2011 Document Reviewed: 08/18/2011 [facility] Patient Information 2015 [place], [place]. This information is not intended to replace advice given to you by your health care provider. Make sure you discuss any questions you have with your health care provider.

## 2015-02-19 ENCOUNTER — Encounter (HOSPITAL_COMMUNITY): Payer: Self-pay | Admitting: Emergency Medicine

## 2015-02-19 ENCOUNTER — Emergency Department (HOSPITAL_COMMUNITY): Payer: Federal, State, Local not specified - PPO

## 2015-02-19 ENCOUNTER — Emergency Department (HOSPITAL_COMMUNITY)
Admission: EM | Admit: 2015-02-19 | Discharge: 2015-02-19 | Disposition: A | Discharge status: Home / Self Care | Payer: Federal, State, Local not specified - PPO | Attending: Emergency Medicine | Admitting: Emergency Medicine

## 2015-02-19 DIAGNOSIS — I10 Essential (primary) hypertension: Secondary | ICD-10-CM | POA: Insufficient documentation

## 2015-02-19 DIAGNOSIS — Z48817 Encounter for surgical aftercare following surgery on the skin and subcutaneous tissue: Secondary | ICD-10-CM | POA: Insufficient documentation

## 2015-02-19 DIAGNOSIS — R1031 Right lower quadrant pain: Secondary | ICD-10-CM | POA: Insufficient documentation

## 2015-02-19 DIAGNOSIS — IMO0002 Reserved for concepts with insufficient information to code with codable children: Secondary | ICD-10-CM

## 2015-02-19 DIAGNOSIS — Z794 Long term (current) use of insulin: Secondary | ICD-10-CM | POA: Diagnosis not present

## 2015-02-19 DIAGNOSIS — E119 Type 2 diabetes mellitus without complications: Secondary | ICD-10-CM | POA: Diagnosis not present

## 2015-02-19 DIAGNOSIS — Z79899 Other long term (current) drug therapy: Secondary | ICD-10-CM | POA: Diagnosis not present

## 2015-02-19 LAB — BASIC METABOLIC PANEL
Anion gap: 11 (ref 5–15)
BUN: 11 mg/dL (ref 6–20)
CHLORIDE: 103 mmol/L (ref 101–111)
CO2: 26 mmol/L (ref 22–32)
CREATININE: 0.98 mg/dL (ref 0.44–1.00)
Calcium: 9.8 mg/dL (ref 8.9–10.3)
GFR calc non Af Amer: >60 mL/min (ref >60)
GLUCOSE: 150 mg/dL — AB (ref 65–99)
Potassium: 3.3 mmol/L — ABNORMAL LOW (ref 3.5–5.1)
Sodium: 140 mmol/L (ref 135–145)

## 2015-02-19 LAB — CBC WITH DIFFERENTIAL/PLATELET
BASOS PCT: 0 % (ref 0–1)
Basophils Absolute: 0 10*3/uL (ref 0.0–0.1)
Eosinophils Absolute: 0.1 10*3/uL (ref 0.0–0.7)
Eosinophils Relative: 1 % (ref 0–5)
HEMATOCRIT: 36 % (ref 36.0–46.0)
HEMOGLOBIN: 11 g/dL — AB (ref 12.0–15.0)
LYMPHS ABS: 2 10*3/uL (ref 0.7–4.0)
LYMPHS PCT: 31 % (ref 12–46)
MCH: 23.3 pg — AB (ref 26.0–34.0)
MCHC: 30.6 g/dL (ref 30.0–36.0)
MCV: 76.3 fL — AB (ref 78.0–100.0)
MONO ABS: 0.4 10*3/uL (ref 0.1–1.0)
MONOS PCT: 6 % (ref 3–12)
NEUTROS ABS: 4.1 10*3/uL (ref 1.7–7.7)
NEUTROS PCT: 62 % (ref 43–77)
Platelets: 435 10*3/uL — ABNORMAL HIGH (ref 150–400)
RBC: 4.72 MIL/uL (ref 3.87–5.11)
RDW: 15.2 % (ref 11.5–15.5)
WBC: 6.6 10*3/uL (ref 4.0–10.5)

## 2015-02-19 LAB — SEDIMENTATION RATE: Sed Rate: 42 mm/hr — ABNORMAL HIGH (ref 0–22)

## 2015-02-19 MED ORDER — IOHEXOL 300 MG/ML  SOLN
100.0000 mL | Freq: Once | INTRAMUSCULAR | Status: AC | PRN
  Administered 2015-02-19: 100 mL via INTRAVENOUS

## 2015-02-19 NOTE — ED Notes (Signed)
Bed: WA07 Expected date:  Expected time:  Means of arrival:  Comments: Hold for triage 6

## 2015-02-19 NOTE — ED Provider Notes (Signed)
CSN: [id number]     Arrival date & time 02/19/15  1627 History  This chart was scribed for non-physician practitioner, Maximiano Coss, PA-C, working with Leo Grosser, MD, by Helane Gunther ED Scribe. This patient was seen in room WTR6/WTR6 and the patient's care was started at 5:10 PM    Chief Complaint  Patient presents with  . Wound Check    r/abdominal abscess   The history is provided by the patient. No language interpreter was used.   HPI Comments: Sara Mcfarland is a 50 y.o. female with a history of htn, diabetes, and thyroid disease who presents to the Emergency Department for a wound check of a painful RLQ abdominal abscess for which she received an I&D procedure 2 days ago. She reports increased pain after the packing was replaced by her husband. She has been taking her prescribed keflex and bactrim. She has not taken any pain medication today and states she was feeling all right up until a short while ago. She notes she was taking it on a regular basis before, but thought she would try to see how she did without it. Pt denies fever, chills, nausea, or vomiting.   Past Medical History  Diagnosis Date  . Hypertension   . Diabetes mellitus   . Hyperthyroidism     h/o Graves disease   Past Surgical History  Procedure Laterality Date  . Hernia repair    . Cesarean section    . Tubal ligation     Family History  Problem Relation Age of Onset  . Diabetes Mother   . Hypertension Mother   . Diabetes Father   . Hypertension Father    Social History  Substance Use Topics  . Smoking status: Never Smoker   . Smokeless tobacco: None  . Alcohol Use: No   OB History    No data available     Review of Systems  Constitutional: Negative for fever.  Gastrointestinal: Negative for nausea and vomiting.  Skin: Positive for color change and wound.  All other systems reviewed and are negative.   Allergies  Review of patient's allergies indicates no known allergies.  Home  Medications   Prior to Admission medications   Medication Sig Start Date End Date Taking? Authorizing Provider  cephALEXin (KEFLEX) 500 MG capsule Take 1 capsule (500 mg total) by mouth 4 (four) times daily. 02/17/15  Yes Ardis Fullwood Patel-Mills, PA-C  ferrous sulfate 325 (65 FE) MG tablet Take 325 mg by mouth every evening.   Yes Historical Provider, MD  ibuprofen (ADVIL,MOTRIN) 200 MG tablet Take 400 mg by mouth every 6 (six) hours as needed for mild pain.   Yes Historical Provider, MD  insulin glargine (LANTUS) 100 UNIT/ML injection Inject 40 Units into the skin at bedtime.    Yes Historical Provider, MD  levothyroxine (SYNTHROID, LEVOTHROID) 125 MCG tablet Take 1 tablet by mouth daily. 02/05/15  Yes Historical Provider, MD  metFORMIN (GLUCOPHAGE) 1000 MG tablet Take 1,000 mg by mouth 2 (two) times daily with a meal.     Yes Historical Provider, MD  omeprazole (PRILOSEC) 20 MG capsule Take 1 capsule by mouth daily. 02/09/15  Yes Historical Provider, MD  pravastatin (PRAVACHOL) 40 MG tablet Take 1 tablet by mouth daily. 02/09/15  Yes Historical Provider, MD  sulfamethoxazole-trimethoprim (BACTRIM DS,SEPTRA DS) 800-160 MG per tablet Take 1 tablet by mouth 2 (two) times daily. 02/17/15 02/24/15 Yes Ayanni Tun Patel-Mills, PA-C  valsartan-hydrochlorothiazide (DIOVAN-HCT) 160-12.5 MG per tablet Take 1 tablet by mouth daily.  Yes Historical Provider, MD  Vitamin D, Ergocalciferol, (DRISDOL) 50000 UNITS CAPS capsule Take 50,000 Units by mouth 2 (two) times a week.   Yes Historical Provider, MD   BP 170/85 mmHg  Pulse 82  Temp(Src) 98.2 F (36.8 C) (Oral)  Resp 18  SpO2 100%  LMP 02/17/2015 Physical Exam  Constitutional: She is oriented to person, place, and time. She appears well-developed and well-nourished.  HENT:  Head: Normocephalic and atraumatic.  Eyes: Conjunctivae are normal.  Neck: Neck supple.  Cardiovascular: Normal rate, regular rhythm and normal heart sounds.   Pulmonary/Chest: Effort normal  and breath sounds normal. No respiratory distress.  Abdominal: Soft.  Musculoskeletal: Normal range of motion.  Neurological: She is alert and oriented to person, place, and time.  Skin: Skin is warm and dry. She is not diaphoretic.  Large right abdominal abscess without active drainage. Packing in place. The abscess measures 6 x 6 cm with surrounding erythema, tenderness and warmth. It does not appear any better or worse than when I saw the patient 2 days ago.  Psychiatric: She has a normal mood and affect.  Nursing note and vitals reviewed.   ED Course  Procedures  DIAGNOSTIC STUDIES: Oxygen Saturation is 100% on RA, normal by my interpretation.    COORDINATION OF CARE: 5:13 PM - Discussed plans to re-pack the abscess. Will order pain medication as well as diagnostic studies and imaging. Pt advised of plan for treatment and pt agrees.  Labs Review Labs Reviewed  CBC WITH DIFFERENTIAL/PLATELET - Abnormal; Notable for the following:    Hemoglobin 11.0 (*)    MCV 76.3 (*)    MCH 23.3 (*)    Platelets 435 (*)    All other components within normal limits  BASIC METABOLIC PANEL - Abnormal; Notable for the following:    Potassium 3.3 (*)    Glucose, Bld 150 (*)    All other components within normal limits  SEDIMENTATION RATE - Abnormal; Notable for the following:    Sed Rate 42 (*)    All other components within normal limits    Imaging Review Ct Abdomen Pelvis W Contrast  02/19/2015   CLINICAL DATA:  Assess wound, following drainage of right lower quadrant abdominal abscess. Worsening right lower quadrant abdominal pain. Initial encounter.  EXAM: CT ABDOMEN AND PELVIS WITH CONTRAST  TECHNIQUE: Multidetector CT imaging of the abdomen and pelvis was performed using the standard protocol following bolus administration of intravenous contrast.  CONTRAST:  151mL OMNIPAQUE IOHEXOL 300 MG/ML  SOLN  COMPARISON:  None.  FINDINGS: The visualized lung bases are clear.  At the lateral aspect of  the right lower quadrant, there is a focal soft tissue defect measuring 1.7 cm in size, with surrounding soft tissue inflammation and skin thickening, compatible with the patient's drained abscess. Soft tissue inflammation remains relatively superficial in nature. There is no evidence of recurrent abscess, and no abnormal soft tissue air is seen.  The liver and spleen are unremarkable in appearance. The gallbladder is within normal limits. The pancreas and adrenal glands are unremarkable.  The kidneys are unremarkable in appearance. There is no evidence of hydronephrosis. No renal or ureteral stones are seen. No perinephric stranding is appreciated.  No free fluid is identified. The small bowel is unremarkable in appearance. The stomach is within normal limits. No acute vascular abnormalities are seen.  The appendix is normal in caliber and contains air, without evidence of appendicitis. The colon is unremarkable in appearance.  The bladder is mildly  distended and grossly unremarkable. The uterus is within normal limits. The ovaries are relatively symmetric. No suspicious adnexal masses are seen. No inguinal lymphadenopathy is seen.  A tiny periumbilical hernia is noted, containing only fat.  No acute osseous abnormalities are identified.  IMPRESSION: 1. Focal soft tissue defect measuring 1.7 cm in size at the lateral aspect of the right lower quadrant, with surrounding soft tissue inflammation and skin thickening, reflecting the patient's recently drained abscess. Soft tissue inflammation remains relatively superficial in nature. No evidence of recurrent abscess. 2. Tiny periumbilical hernia, containing only fat.   Electronically Signed   By: Garald Balding M.D.   On: 02/19/2015 19:34   I have personally reviewed and evaluated these images and lab results as part of my medical decision-making.   EKG Interpretation None      MDM   Final diagnoses:  Abscess, abdomen  Patient presents for right abdominal  abscess with surrounding cellulitis.  Her vitals are stable and she is afebrile. Labs are not concerning for deep infection.  CT abdomen shows soft tissue infection in RLQ but that it is superficial in nature. I discussed continuing the antibiotics and following up with her pcp next week.  I discussed return precautions.  Patient verbally agrees with the plan.   I personally performed the services described in this documentation, which was scribed in my presence. The recorded information has been reviewed and is accurate.   Ottie Glazier, PA-C 02/19/15 2008  Leo Grosser, MD 02/20/15 [phone]

## 2015-02-19 NOTE — ED Notes (Signed)
Abscess -post I and D on r/lower abdomen. Drainage , moderate amount  sanguinous. Denies fever. Motrin used for occasional pain

## 2015-02-19 NOTE — Discharge Instructions (Signed)
Abscess Follow up with your primary care provider.  An abscess (boil or furuncle) is an infected area on or under the skin. This area is filled with yellowish-white fluid (pus) and other material (debris). HOME CARE   Only take medicines as told by your doctor.  If you were given antibiotic medicine, take it as directed. Finish the medicine even if you start to feel better.  If gauze is used, follow your doctor's directions for changing the gauze.  To avoid spreading the infection:  Keep your abscess covered with a bandage.  Wash your hands well.  Do not share personal care items, towels, or whirlpools with others.  Avoid skin contact with others.  Keep your skin and clothes clean around the abscess.  Keep all doctor visits as told. GET HELP RIGHT AWAY IF:   You have more pain, puffiness (swelling), or redness in the wound site.  You have more fluid or blood coming from the wound site.  You have muscle aches, chills, or you feel sick.  You have a fever. MAKE SURE YOU:   Understand these instructions.  Will watch your condition.  Will get help right away if you are not doing well or get worse. Document Released: 11/22/2007 Document Revised: 12/05/2011 Document Reviewed: 08/18/2011 [facility] Patient Information 2015 [place], [place]. This information is not intended to replace advice given to you by your health care provider. Make sure you discuss any questions you have with your health care provider.

## 2015-11-11 DIAGNOSIS — I10 Essential (primary) hypertension: Secondary | ICD-10-CM | POA: Diagnosis not present

## 2015-11-11 DIAGNOSIS — E119 Type 2 diabetes mellitus without complications: Secondary | ICD-10-CM | POA: Diagnosis not present

## 2015-11-11 DIAGNOSIS — E039 Hypothyroidism, unspecified: Secondary | ICD-10-CM | POA: Diagnosis not present

## 2016-03-30 DIAGNOSIS — E039 Hypothyroidism, unspecified: Secondary | ICD-10-CM | POA: Diagnosis not present

## 2016-03-30 DIAGNOSIS — Z23 Encounter for immunization: Secondary | ICD-10-CM | POA: Diagnosis not present

## 2016-03-30 DIAGNOSIS — E119 Type 2 diabetes mellitus without complications: Secondary | ICD-10-CM | POA: Diagnosis not present

## 2016-03-30 DIAGNOSIS — I10 Essential (primary) hypertension: Secondary | ICD-10-CM | POA: Diagnosis not present

## 2016-04-10 DIAGNOSIS — M778 Other enthesopathies, not elsewhere classified: Secondary | ICD-10-CM | POA: Diagnosis not present

## 2016-07-07 DIAGNOSIS — R509 Fever, unspecified: Secondary | ICD-10-CM | POA: Diagnosis not present

## 2016-07-27 DIAGNOSIS — Z01419 Encounter for gynecological examination (general) (routine) without abnormal findings: Secondary | ICD-10-CM | POA: Diagnosis not present

## 2016-07-27 DIAGNOSIS — Z1231 Encounter for screening mammogram for malignant neoplasm of breast: Secondary | ICD-10-CM | POA: Diagnosis not present

## 2016-07-27 DIAGNOSIS — Z124 Encounter for screening for malignant neoplasm of cervix: Secondary | ICD-10-CM | POA: Diagnosis not present

## 2016-07-27 DIAGNOSIS — Z6835 Body mass index (BMI) 35.0-35.9, adult: Secondary | ICD-10-CM | POA: Diagnosis not present

## 2016-07-27 DIAGNOSIS — Z113 Encounter for screening for infections with a predominantly sexual mode of transmission: Secondary | ICD-10-CM | POA: Diagnosis not present

## 2016-11-02 DIAGNOSIS — I10 Essential (primary) hypertension: Secondary | ICD-10-CM | POA: Diagnosis not present

## 2016-11-02 DIAGNOSIS — M2550 Pain in unspecified joint: Secondary | ICD-10-CM | POA: Diagnosis not present

## 2016-11-02 DIAGNOSIS — E119 Type 2 diabetes mellitus without complications: Secondary | ICD-10-CM | POA: Diagnosis not present

## 2016-11-02 DIAGNOSIS — E039 Hypothyroidism, unspecified: Secondary | ICD-10-CM | POA: Diagnosis not present

## 2016-11-02 DIAGNOSIS — E559 Vitamin D deficiency, unspecified: Secondary | ICD-10-CM | POA: Diagnosis not present

## 2017-03-05 IMAGING — CT CT ABD-PELV W/ CM
2 of 5 series · 16 of 46 positions shown, 18 images · IV contrast (OMNIPAQUE 300)
Comparison: None.

CLINICAL DATA: Assess wound, following drainage of right lower
quadrant abdominal abscess. Worsening right lower quadrant abdominal
pain. Initial encounter.

EXAM:
CT ABDOMEN AND PELVIS WITH CONTRAST
TECHNIQUE: Multidetector CT imaging of the abdomen and pelvis was performed
using the standard protocol following bolus administration of
intravenous contrast.
CONTRAST:  100mL OMNIPAQUE IOHEXOL 300 MG/ML  SOLN

[Series 2: abd/pel with · axial · 0.80mm/px · z∈[+1096,+1492]mm · 13 of 89 slices shown, 15 images]
[im 5/89  soft-tissue]
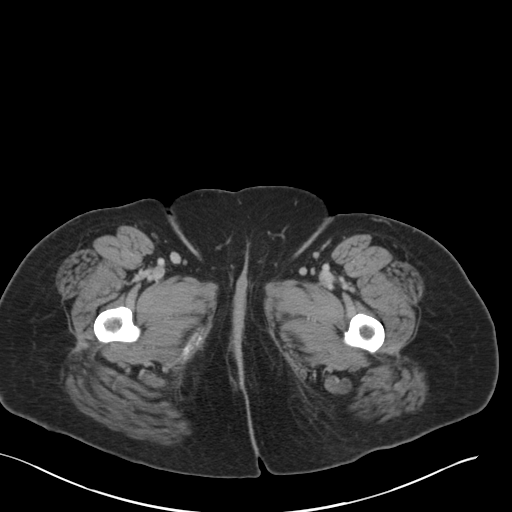
[im 5/89  bone]
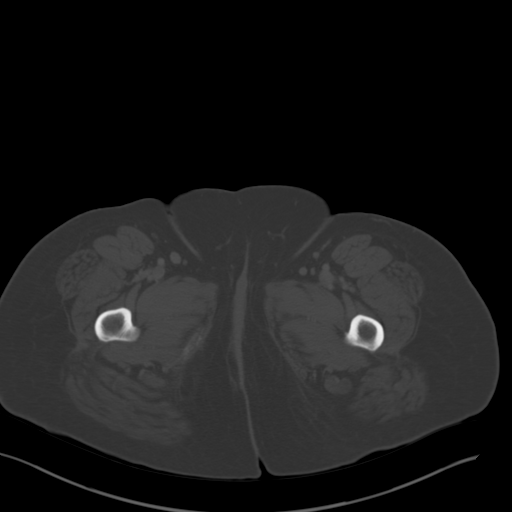
[im 14/89  soft-tissue]
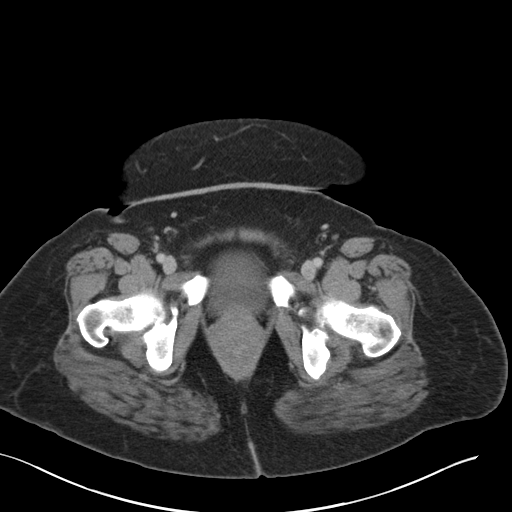
[im 19/89  soft-tissue]
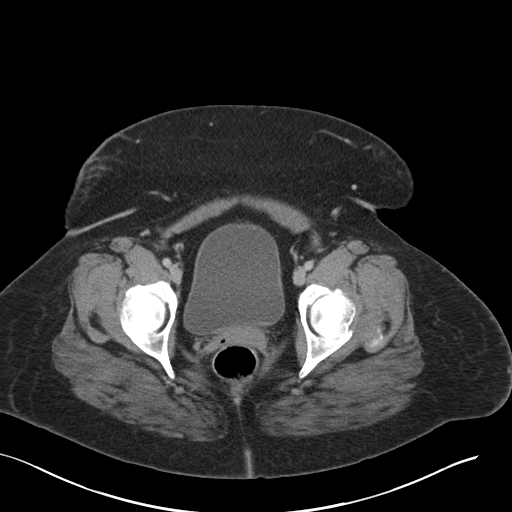
[im 24/89  soft-tissue]
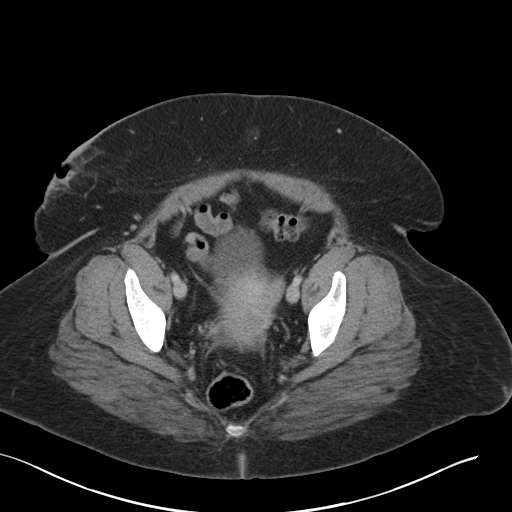
[im 33/89  soft-tissue]
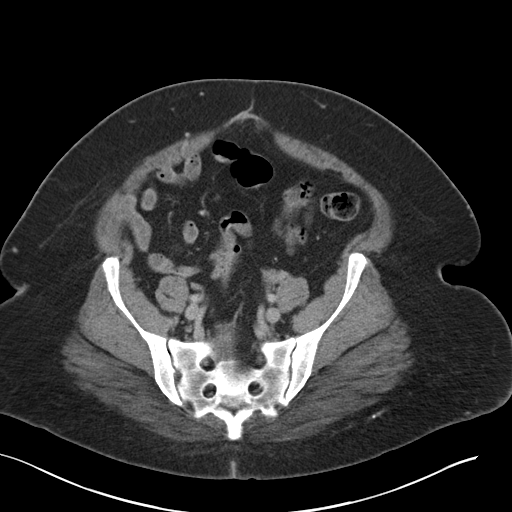
[im 38/89  soft-tissue]
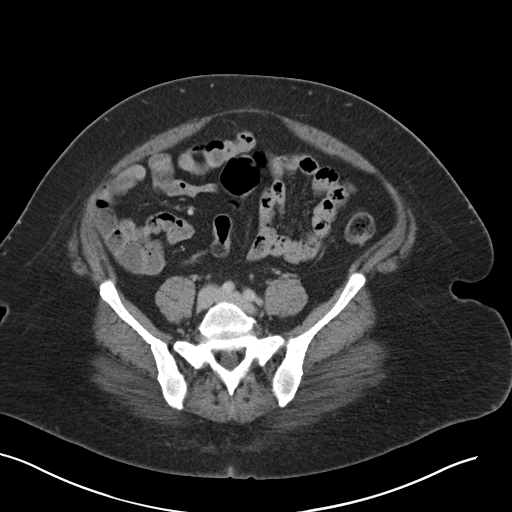
[im 47/89  soft-tissue]
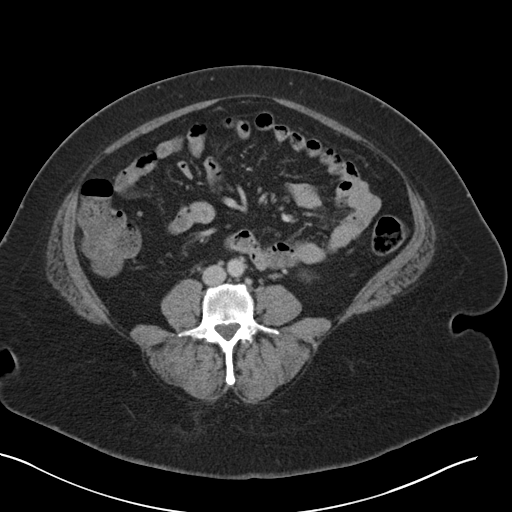
[im 51/89  soft-tissue]
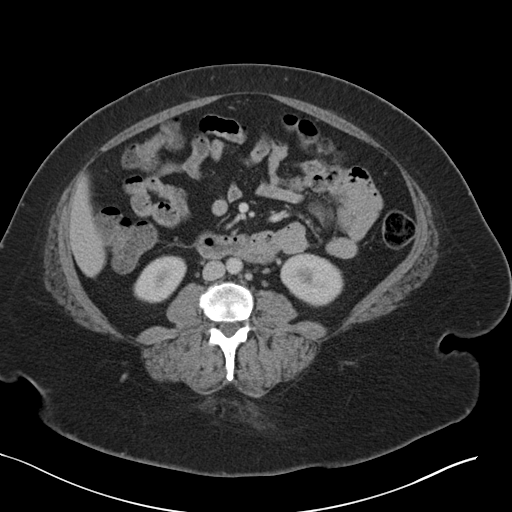
[im 56/89  soft-tissue]
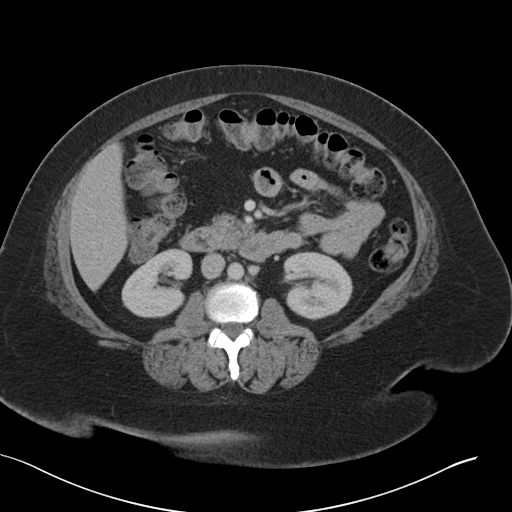
[im 56/89  bone]
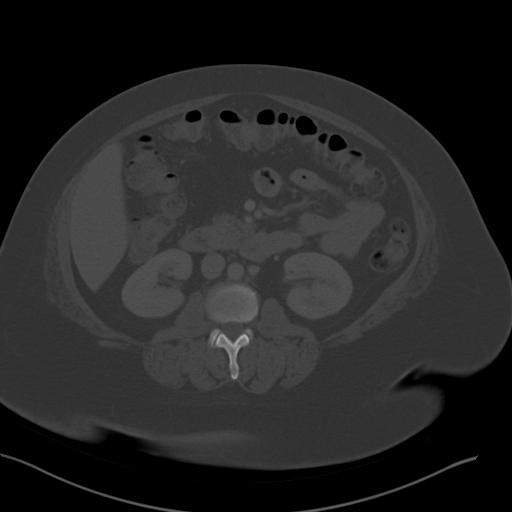
[im 65/89  soft-tissue]
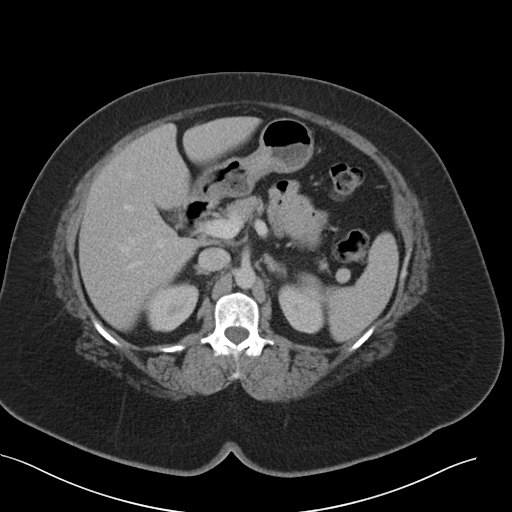
[im 70/89  soft-tissue]
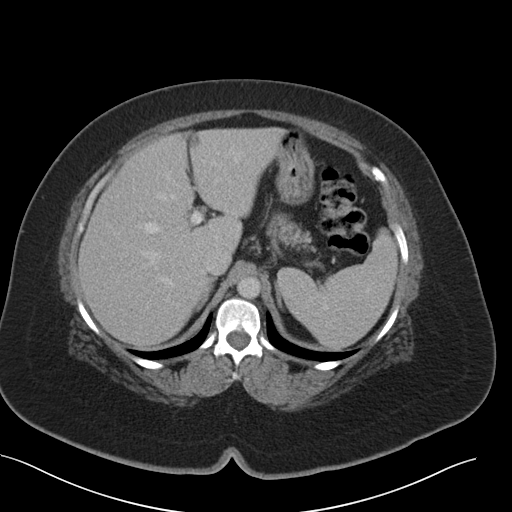
[im 75/89  soft-tissue]
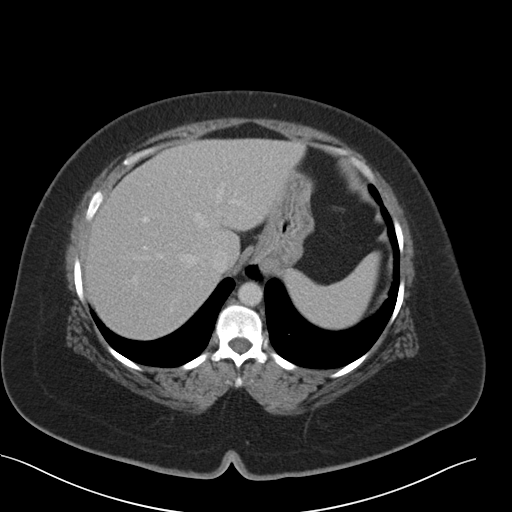
[im 84/89  soft-tissue]
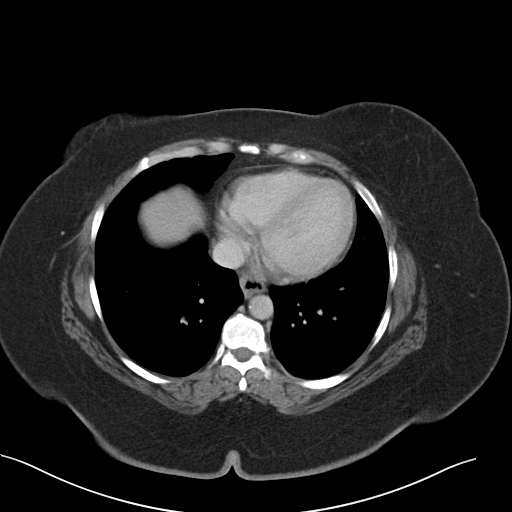

[Series 4: coronal a/|p · coronal · 0.74mm/px · 3 of 124 slices shown]
[im 42/124  soft-tissue]
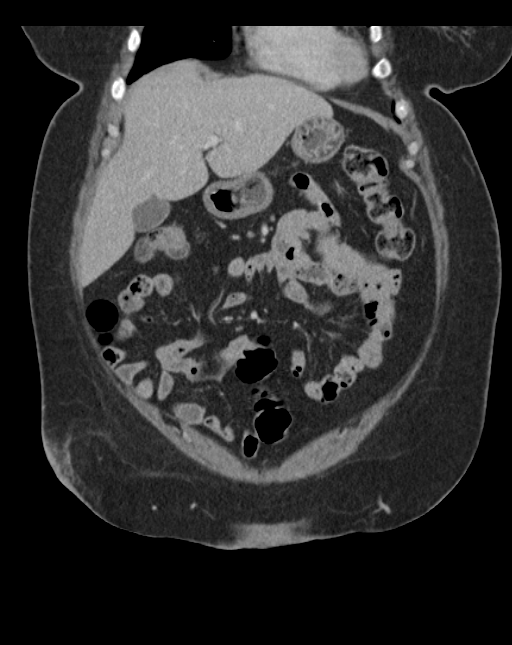
[im 55/124  soft-tissue]
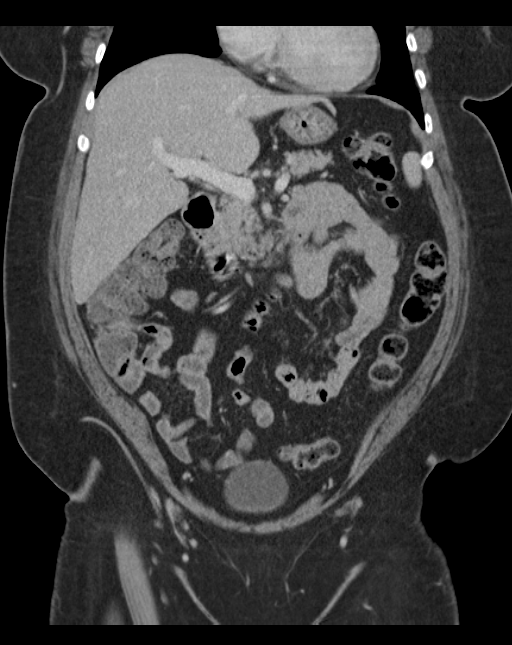
[im 69/124  soft-tissue]
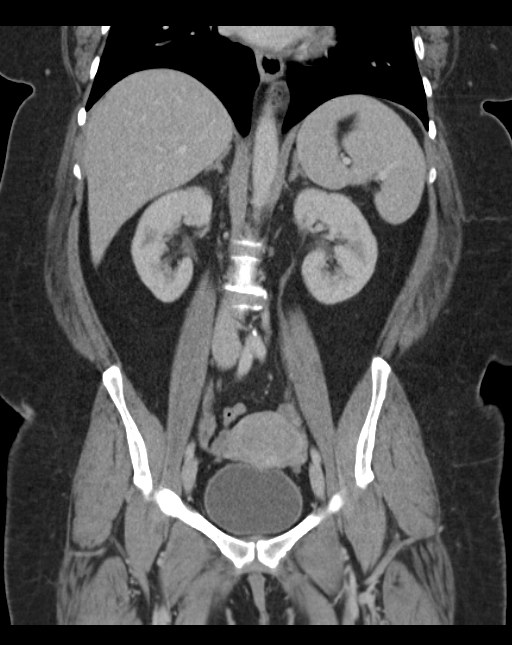

[16 of 46 positions shown; findings below may reference images not displayed]

FINDINGS: The visualized lung bases are clear.

At the lateral aspect of the right lower quadrant, there is a focal
soft tissue defect measuring 1.7 cm in size, with surrounding soft
tissue inflammation and skin thickening, compatible with the
patient's drained abscess. Soft tissue inflammation remains
relatively superficial in nature. There is no evidence of recurrent
abscess, and no abnormal soft tissue air is seen.

The liver and spleen are unremarkable in appearance. The gallbladder
is within normal limits. The pancreas and adrenal glands are
unremarkable.

The kidneys are unremarkable in appearance. There is no evidence of
hydronephrosis. No renal or ureteral stones are seen. No perinephric
stranding is appreciated.

No free fluid is identified. The small bowel is unremarkable in
appearance. The stomach is within normal limits. No acute vascular
abnormalities are seen.

The appendix is normal in caliber and contains air, without evidence
of appendicitis. The colon is unremarkable in appearance.

The bladder is mildly distended and grossly unremarkable. The uterus
is within normal limits. The ovaries are relatively symmetric. No
suspicious adnexal masses are seen. No inguinal lymphadenopathy is
seen.

A tiny periumbilical hernia is noted, containing only fat.

No acute osseous abnormalities are identified.
IMPRESSION: 1. Focal soft tissue defect measuring 1.7 cm in size at the lateral
aspect of the right lower quadrant, with surrounding soft tissue
inflammation and skin thickening, reflecting the patient's recently
drained abscess. Soft tissue inflammation remains relatively
superficial in nature. No evidence of recurrent abscess.
2. Tiny periumbilical hernia, containing only fat.

## 2017-04-26 DIAGNOSIS — D509 Iron deficiency anemia, unspecified: Secondary | ICD-10-CM | POA: Diagnosis not present

## 2017-04-26 DIAGNOSIS — E119 Type 2 diabetes mellitus without complications: Secondary | ICD-10-CM | POA: Diagnosis not present

## 2017-04-26 DIAGNOSIS — I10 Essential (primary) hypertension: Secondary | ICD-10-CM | POA: Diagnosis not present

## 2017-04-26 DIAGNOSIS — E039 Hypothyroidism, unspecified: Secondary | ICD-10-CM | POA: Diagnosis not present

## 2017-04-26 DIAGNOSIS — M2550 Pain in unspecified joint: Secondary | ICD-10-CM | POA: Diagnosis not present

## 2017-07-31 DIAGNOSIS — E039 Hypothyroidism, unspecified: Secondary | ICD-10-CM | POA: Diagnosis not present

## 2017-07-31 DIAGNOSIS — Z01419 Encounter for gynecological examination (general) (routine) without abnormal findings: Secondary | ICD-10-CM | POA: Diagnosis not present

## 2017-07-31 DIAGNOSIS — E11622 Type 2 diabetes mellitus with other skin ulcer: Secondary | ICD-10-CM | POA: Diagnosis not present

## 2017-07-31 DIAGNOSIS — Z1231 Encounter for screening mammogram for malignant neoplasm of breast: Secondary | ICD-10-CM | POA: Diagnosis not present

## 2017-07-31 DIAGNOSIS — Z124 Encounter for screening for malignant neoplasm of cervix: Secondary | ICD-10-CM | POA: Diagnosis not present

## 2017-07-31 DIAGNOSIS — Z6835 Body mass index (BMI) 35.0-35.9, adult: Secondary | ICD-10-CM | POA: Diagnosis not present

## 2018-01-28 DIAGNOSIS — Z794 Long term (current) use of insulin: Secondary | ICD-10-CM | POA: Diagnosis not present

## 2018-01-28 DIAGNOSIS — E1165 Type 2 diabetes mellitus with hyperglycemia: Secondary | ICD-10-CM | POA: Diagnosis not present

## 2018-01-28 DIAGNOSIS — E039 Hypothyroidism, unspecified: Secondary | ICD-10-CM | POA: Diagnosis not present

## 2018-05-03 DIAGNOSIS — E1165 Type 2 diabetes mellitus with hyperglycemia: Secondary | ICD-10-CM | POA: Diagnosis not present

## 2018-05-03 DIAGNOSIS — Z794 Long term (current) use of insulin: Secondary | ICD-10-CM | POA: Diagnosis not present

## 2018-07-01 DIAGNOSIS — B029 Zoster without complications: Secondary | ICD-10-CM | POA: Diagnosis not present

## 2018-08-23 DIAGNOSIS — I10 Essential (primary) hypertension: Secondary | ICD-10-CM | POA: Diagnosis not present

## 2019-01-27 DIAGNOSIS — E1165 Type 2 diabetes mellitus with hyperglycemia: Secondary | ICD-10-CM | POA: Diagnosis not present

## 2019-01-27 DIAGNOSIS — Z794 Long term (current) use of insulin: Secondary | ICD-10-CM | POA: Diagnosis not present

## 2019-02-11 DIAGNOSIS — E039 Hypothyroidism, unspecified: Secondary | ICD-10-CM | POA: Diagnosis not present

## 2019-03-03 DIAGNOSIS — Z124 Encounter for screening for malignant neoplasm of cervix: Secondary | ICD-10-CM | POA: Diagnosis not present

## 2019-03-03 DIAGNOSIS — Z1231 Encounter for screening mammogram for malignant neoplasm of breast: Secondary | ICD-10-CM | POA: Diagnosis not present

## 2019-03-03 DIAGNOSIS — Z01419 Encounter for gynecological examination (general) (routine) without abnormal findings: Secondary | ICD-10-CM | POA: Diagnosis not present

## 2019-03-03 DIAGNOSIS — Z1211 Encounter for screening for malignant neoplasm of colon: Secondary | ICD-10-CM | POA: Diagnosis not present

## 2019-03-19 ENCOUNTER — Encounter: Payer: Self-pay | Admitting: Internal Medicine

## 2019-04-11 ENCOUNTER — Encounter: Payer: Self-pay | Admitting: Internal Medicine

## 2019-04-11 ENCOUNTER — Ambulatory Visit (AMBULATORY_SURGERY_CENTER): Payer: Self-pay | Admitting: *Deleted

## 2019-04-11 ENCOUNTER — Other Ambulatory Visit: Payer: Self-pay

## 2019-04-11 VITALS — Temp 97.1°F | Ht 62.0 in | Wt 145.4 lb

## 2019-04-11 DIAGNOSIS — Z1211 Encounter for screening for malignant neoplasm of colon: Secondary | ICD-10-CM

## 2019-04-11 DIAGNOSIS — Z1159 Encounter for screening for other viral diseases: Secondary | ICD-10-CM

## 2019-04-11 MED ORDER — NA SULFATE-K SULFATE-MG SULF 17.5-3.13-1.6 GM/177ML PO SOLN: 1.0000 | Freq: Once | ORAL | 0 refills | Status: AC

## 2019-04-11 NOTE — Progress Notes (Signed)
No egg or soy allergy known to patient  No issues with past sedation with any surgeries  or procedures, no intubation problems  No diet pills per patient No home 02 use per patient  No blood thinners per patient  Pt denies issues with constipation  No A fib or A flutter  EMMI video sent to pt's e mail   Due to the COVID-19 pandemic we are asking patients to follow these guidelines. Please only bring one care partner. Please be aware that your care partner may wait in the car in the parking lot or if they feel like they will be too hot to wait in the car, they may wait in the lobby on the 4th floor. All care partners are required to wear a mask the entire time (we do not have any that we can provide them), they need to practice social distancing, and we will do a Covid check for all patient's and care partners when you arrive. Also we will check their temperature and your temperature. If the care partner waits in their car they need to stay in the parking lot the entire time and we will call them on their cell phone when the patient is ready for discharge so they can bring the car to the front of the building. Also all patient's will need to wear a mask into building.  COVID SCREENING 04/22/19,8:50 AM  SUPREP COUPON PROVIDED.

## 2019-04-22 ENCOUNTER — Other Ambulatory Visit: Payer: Self-pay | Admitting: Internal Medicine

## 2019-04-22 DIAGNOSIS — Z1159 Encounter for screening for other viral diseases: Secondary | ICD-10-CM | POA: Diagnosis not present

## 2019-04-22 LAB — SARS CORONAVIRUS 2 (TAT 6-24 HRS): SARS Coronavirus 2: NEGATIVE

## 2019-04-25 ENCOUNTER — Other Ambulatory Visit: Payer: Self-pay

## 2019-04-25 ENCOUNTER — Encounter: Payer: Self-pay | Admitting: Internal Medicine

## 2019-04-25 ENCOUNTER — Ambulatory Visit (AMBULATORY_SURGERY_CENTER): Payer: Federal, State, Local not specified - PPO | Admitting: Internal Medicine

## 2019-04-25 VITALS — BP 141/73 | HR 84 | Temp 97.8°F | Resp 12 | Ht 62.0 in | Wt 145.0 lb

## 2019-04-25 DIAGNOSIS — Z1211 Encounter for screening for malignant neoplasm of colon: Secondary | ICD-10-CM | POA: Diagnosis not present

## 2019-04-25 DIAGNOSIS — D125 Benign neoplasm of sigmoid colon: Secondary | ICD-10-CM

## 2019-04-25 DIAGNOSIS — K635 Polyp of colon: Secondary | ICD-10-CM

## 2019-04-25 MED ORDER — SODIUM CHLORIDE 0.9 % IV SOLN: 500.0000 mL | Freq: Once | INTRAVENOUS | Status: DC

## 2019-04-25 NOTE — Op Note (Signed)
[facility] Patient Name: Sara Mcfarland Procedure Date: 04/25/2019 8:04 AM MRN: [id number] Endoscopist: Docia Chuck. Henrene Pastor , MD Age: 53 Referring MD:  Date of Birth: [birth year] Gender: Female Account #: [id number] Procedure:                Colonoscopy with cold snare polypectomy x 1 Indications:              Screening for colorectal malignant neoplasm Medicines:                Monitored Anesthesia Care Procedure:                Pre-Anesthesia Assessment:                           - Prior to the procedure, a History and Physical                            was performed, and patient medications and                            allergies were reviewed. The patient's tolerance of                            previous anesthesia was also reviewed. The risks                            and benefits of the procedure and the sedation                            options and risks were discussed with the patient.                            All questions were answered, and informed consent                            was obtained. Prior Anticoagulants: The patient has                            taken no previous anticoagulant or antiplatelet                            agents. ASA Grade Assessment: II - A patient with                            mild systemic disease. After reviewing the risks                            and benefits, the patient was deemed in                            satisfactory condition to undergo the procedure.                           After obtaining informed consent, the colonoscope  was passed under direct vision. Throughout the                            procedure, the patient's blood pressure, pulse, and                            oxygen saturations were monitored continuously. The                            Colonoscope was introduced through the anus and                            advanced to the the cecum, identified by        appendiceal orifice and ileocecal valve. The                            ileocecal valve, appendiceal orifice, and rectum                            were photographed. The quality of the bowel                            preparation was excellent. The colonoscopy was                            performed without difficulty. The patient tolerated                            the procedure well. The bowel preparation used was                            SUPREP via split dose instruction. Scope In: 8:16:46 AM Scope Out: 8:36:20 AM Scope Withdrawal Time: 0 hours 12 minutes 47 seconds  Total Procedure Duration: 0 hours 19 minutes 34 seconds  Findings:                 A 2 mm polyp was found in the sigmoid colon. The                            polyp was removed with a cold snare. Resection and                            retrieval were complete.                           Internal hemorrhoids were found during                            retroflexion. The hemorrhoids were small.                           The exam was otherwise without abnormality on  direct and retroflexion views. Complications:            No immediate complications. Estimated blood loss:                            None. Estimated Blood Loss:     Estimated blood loss: none. Impression:               - One 2 mm polyp in the sigmoid colon, removed with                            a cold snare. Resected and retrieved.                           - Internal hemorrhoids.                           - The examination was otherwise normal on direct                            and retroflexion views. Recommendation:           - Repeat colonoscopy in 10 years for surveillance.                           - Patient has a contact number available for                            emergencies. The signs and symptoms of potential                            delayed complications were discussed with the                             patient. Return to normal activities tomorrow.                            Written discharge instructions were provided to the                            patient.                           - Resume previous diet.                           - Continue present medications.                           - Await pathology results. Docia Chuck. Henrene Pastor, MD 04/25/2019 8:45:40 AM This report has been signed electronically.

## 2019-04-25 NOTE — Patient Instructions (Signed)
  HANDOUTS: POLYPS AND HEMORRHOIDS.  YOU HAD AN ENDOSCOPIC PROCEDURE TODAY AT [facility]:   Refer to the procedure report that was given to you for any specific questions about what was found during the examination.  If the procedure report does not answer your questions, please call your gastroenterologist to clarify.  If you requested that your care partner not be given the details of your procedure findings, then the procedure report has been included in a sealed envelope for you to review at your convenience later.  YOU SHOULD EXPECT: Some feelings of bloating in the abdomen. Passage of more gas than usual.  Walking can help get rid of the air that was put into your GI tract during the procedure and reduce the bloating. If you had a lower endoscopy (such as a colonoscopy or flexible sigmoidoscopy) you may notice spotting of blood in your stool or on the toilet paper. If you underwent a bowel prep for your procedure, you may not have a normal bowel movement for a few days.  Please Note:  You might notice some irritation and congestion in your nose or some drainage.  This is from the oxygen used during your procedure.  There is no need for concern and it should clear up in a day or so.  SYMPTOMS TO REPORT IMMEDIATELY:   Following lower endoscopy (colonoscopy or flexible sigmoidoscopy):  Excessive amounts of blood in the stool  Significant tenderness or worsening of abdominal pains  Swelling of the abdomen that is new, acute  Fever of 100F or higher   For urgent or emergent issues, a gastroenterologist can be reached at any hour by calling [phone].   DIET:  We do recommend a small meal at first, but then you may proceed to your regular diet.  Drink plenty of fluids but you should avoid alcoholic beverages for 24 hours.  ACTIVITY:  You should plan to take it easy for the rest of today and you should NOT DRIVE or use heavy machinery until tomorrow (because of the  sedation medicines used during the test).    FOLLOW UP: Our staff will call the number listed on your records 48-72 hours following your procedure to check on you and address any questions or concerns that you may have regarding the information given to you following your procedure. If we do not reach you, we will leave a message.  We will attempt to reach you two times.  During this call, we will ask if you have developed any symptoms of COVID 19. If you develop any symptoms (ie: fever, flu-like symptoms, shortness of breath, cough etc.) before then, please call [phone].  If you test positive for Covid 19 in the 2 weeks post procedure, please call and report this information to [place].    If any biopsies were taken you will be contacted by phone or by letter within the next 1-3 weeks.  Please call us at [phone] if you have not heard about the biopsies in 3 weeks.    SIGNATURES/CONFIDENTIALITY: You and/or your care partner have signed paperwork which will be entered into your electronic medical record.  These signatures attest to the fact that that the information above on your After Visit Summary has been reviewed and is understood.  Full responsibility of the confidentiality of this discharge information lies with you and/or your care-partner.

## 2019-04-25 NOTE — Progress Notes (Signed)
A/ox3, pleased with MAC, report to RN 

## 2019-04-25 NOTE — Progress Notes (Signed)
Called to room to assist during endoscopic procedure.  Patient ID and intended procedure confirmed with present staff. Received instructions for my participation in the procedure from the performing physician.  

## 2019-04-29 ENCOUNTER — Telehealth: Payer: Self-pay

## 2019-04-29 NOTE — Telephone Encounter (Signed)
  Follow up Call-  Call back number 04/25/2019  Post procedure Call Back phone  # [phone]  Permission to leave phone message Yes  Some recent data might be hidden     Patient questions:  Do you have a fever, pain , or abdominal swelling? No. Pain Score  0 *  Have you tolerated food without any problems? Yes.    Have you been able to return to your normal activities? Yes.    Do you have any questions about your discharge instructions: Diet   No. Medications  No. Follow up visit  No.  Do you have questions or concerns about your Care? No.  Actions: * If pain score is 4 or above: No action needed, pain <4. 1. Have you developed a fever since your procedure? no  2.   Have you had an respiratory symptoms (SOB or cough) since your procedure? no  3.   Have you tested positive for COVID 19 since your procedure no  4.   Have you had any family members/close contacts diagnosed with the COVID 19 since your procedure?  no   If yes to any of these questions please route to Joylene John, RN and Alphonsa Gin, Therapist, sports.

## 2019-04-30 ENCOUNTER — Encounter: Payer: Self-pay | Admitting: Internal Medicine

## 2019-05-05 DIAGNOSIS — Z794 Long term (current) use of insulin: Secondary | ICD-10-CM | POA: Diagnosis not present

## 2019-05-05 DIAGNOSIS — E1165 Type 2 diabetes mellitus with hyperglycemia: Secondary | ICD-10-CM | POA: Diagnosis not present

## 2019-05-05 DIAGNOSIS — I10 Essential (primary) hypertension: Secondary | ICD-10-CM | POA: Diagnosis not present

## 2019-05-05 DIAGNOSIS — E785 Hyperlipidemia, unspecified: Secondary | ICD-10-CM | POA: Diagnosis not present

## 2019-05-05 DIAGNOSIS — E039 Hypothyroidism, unspecified: Secondary | ICD-10-CM | POA: Diagnosis not present

## 2019-11-03 DIAGNOSIS — E785 Hyperlipidemia, unspecified: Secondary | ICD-10-CM | POA: Diagnosis not present

## 2019-11-03 DIAGNOSIS — E039 Hypothyroidism, unspecified: Secondary | ICD-10-CM | POA: Diagnosis not present

## 2019-11-03 DIAGNOSIS — I10 Essential (primary) hypertension: Secondary | ICD-10-CM | POA: Diagnosis not present

## 2019-11-03 DIAGNOSIS — E1165 Type 2 diabetes mellitus with hyperglycemia: Secondary | ICD-10-CM | POA: Diagnosis not present

## 2019-11-03 DIAGNOSIS — Z794 Long term (current) use of insulin: Secondary | ICD-10-CM | POA: Diagnosis not present

## 2020-08-19 DIAGNOSIS — Z01419 Encounter for gynecological examination (general) (routine) without abnormal findings: Secondary | ICD-10-CM | POA: Diagnosis not present

## 2020-08-19 DIAGNOSIS — Z124 Encounter for screening for malignant neoplasm of cervix: Secondary | ICD-10-CM | POA: Diagnosis not present

## 2020-08-19 DIAGNOSIS — Z1231 Encounter for screening mammogram for malignant neoplasm of breast: Secondary | ICD-10-CM | POA: Diagnosis not present

## 2020-09-30 DIAGNOSIS — Z135 Encounter for screening for eye and ear disorders: Secondary | ICD-10-CM | POA: Diagnosis not present

## 2020-09-30 DIAGNOSIS — H259 Unspecified age-related cataract: Secondary | ICD-10-CM | POA: Diagnosis not present

## 2020-10-11 DIAGNOSIS — H25813 Combined forms of age-related cataract, bilateral: Secondary | ICD-10-CM | POA: Diagnosis not present

## 2020-11-01 DIAGNOSIS — E039 Hypothyroidism, unspecified: Secondary | ICD-10-CM | POA: Diagnosis not present

## 2020-11-01 DIAGNOSIS — E785 Hyperlipidemia, unspecified: Secondary | ICD-10-CM | POA: Diagnosis not present

## 2020-11-01 DIAGNOSIS — E1165 Type 2 diabetes mellitus with hyperglycemia: Secondary | ICD-10-CM | POA: Diagnosis not present

## 2020-11-01 DIAGNOSIS — Z794 Long term (current) use of insulin: Secondary | ICD-10-CM | POA: Diagnosis not present

## 2020-11-01 DIAGNOSIS — K219 Gastro-esophageal reflux disease without esophagitis: Secondary | ICD-10-CM | POA: Diagnosis not present

## 2020-11-01 DIAGNOSIS — I10 Essential (primary) hypertension: Secondary | ICD-10-CM | POA: Diagnosis not present
